# Patient Record
Sex: Female | Born: 1995 | Race: Black or African American | Hispanic: No | Marital: Single | Smoking: Never smoker
Health system: Southern US, Community
[De-identification: ages and names within clinical notes are randomized; demographics above are authoritative.]

---

## 2015-01-03 ENCOUNTER — Encounter (HOSPITAL_COMMUNITY): Payer: Self-pay | Admitting: Emergency Medicine

## 2015-01-03 ENCOUNTER — Emergency Department (INDEPENDENT_AMBULATORY_CARE_PROVIDER_SITE_OTHER)
Admission: EM | Admit: 2015-01-03 | Discharge: 2015-01-03 | Disposition: A | Discharge status: Home / Self Care | Payer: BLUE CROSS/BLUE SHIELD | Source: Home / Self Care | Attending: Family Medicine | Admitting: Family Medicine

## 2015-01-03 DIAGNOSIS — R0981 Nasal congestion: Secondary | ICD-10-CM

## 2015-01-03 MED ORDER — IPRATROPIUM BROMIDE 0.06 % NA SOLN: 2.0000 | Freq: Four times a day (QID) | NASAL | Status: DC

## 2015-01-03 NOTE — ED Provider Notes (Signed)
CSN: [id number]     Arrival date & time 01/03/15  [id number] History   First MD Initiated Contact with Patient 01/03/15 1038     No chief complaint on file.  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 female presenting with URI. The history is provided by the patient.  URI Presenting symptoms: congestion, rhinorrhea and sore throat   Presenting symptoms: no cough, no ear pain, no facial pain, no fatigue and no fever   Presenting symptoms comment:  +scratchy throat.  Severity:  Moderate Onset quality:  Gradual Duration:  2 days Timing:  Constant Progression:  Unchanged Chronicity:  New Associated symptoms: no arthralgias, no headaches, no myalgias and no sneezing     No past medical history on file. No past surgical history on file. No family history on file. History  Substance Use Topics  . Smoking status: Not on file  . Smokeless tobacco: Not on file  . Alcohol Use: Not on file   OB History    No data available     Review of Systems  Constitutional: Negative for fever and fatigue.  HENT: Positive for congestion, rhinorrhea, sinus pressure and sore throat. Negative for ear pain, nosebleeds and sneezing.   Eyes: Negative.   Respiratory: Negative for cough.   Cardiovascular: Negative.   Gastrointestinal: Negative.   Musculoskeletal: Negative for myalgias and arthralgias.  Neurological: Negative for headaches.    Allergies  Review of patient's allergies indicates not on file.  Home Medications   Prior to Admission medications   Medication Sig Start Date End Date Taking? Authorizing Provider  ipratropium (ATROVENT) 0.06 % nasal spray Place 2 sprays into both nostrils 4 (four) times daily. 01/03/15   Jess BartersJennifer Lee H Roshad Hack, PA   BP 130/90 mmHg  Pulse 115  Temp(Src) 98.2 F (36.8 C) (Oral)  Resp 18  SpO2 100% Physical Exam  Constitutional: She is  oriented to person, place, and time. She appears well-developed and well-nourished.  +morbid obesity  HENT:  Head: Normocephalic and atraumatic.  Right Ear: Hearing, tympanic membrane, external ear and ear canal normal.  Left Ear: Hearing, tympanic membrane, external ear and ear canal normal.  Nose: Mucosal edema and rhinorrhea present.  Mouth/Throat: Uvula is midline, oropharynx is clear and moist and mucous membranes are normal.  Eyes: Conjunctivae are normal. No scleral icterus.  Neck: Normal range of motion. Neck supple.  Cardiovascular: Normal rate, regular rhythm and normal heart sounds.   Pulmonary/Chest: Effort normal and breath sounds normal.  Musculoskeletal: Normal range of motion.  Lymphadenopathy:    She has no cervical adenopathy.  Neurological: She is alert and oriented to person, place, and time.  Skin: Skin is warm and dry.  Psychiatric: She has a normal mood and affect. Her behavior is normal.  Nursing note and vitals reviewed.   ED Course  Procedures (including critical care time) Labs Review Labs Reviewed - No data to display  Imaging Review No results found.   MDM   1. Sinus congestion   2. Nasal congestion   Atrovent nasal spray as directed.  Mild common cold. Symptomatic care at home     Ria ClockJennifer Lee H Maryln Eastham, [place] 01/03/15 1121

## 2015-01-03 NOTE — Discharge Instructions (Signed)
Upper Respiratory Infection, Adult An upper respiratory infection (URI) is also sometimes known as the common cold. The upper respiratory tract includes the nose, sinuses, throat, trachea, and bronchi. Bronchi are the airways leading to the lungs. Most people improve within 1 week, but symptoms can last up to 2 weeks. A residual cough may last even longer.  CAUSES Many different viruses can infect the tissues lining the upper respiratory tract. The tissues become irritated and inflamed and often become very moist. Mucus production is also common. A cold is contagious. You can easily spread the virus to others by oral contact. This includes kissing, sharing a glass, coughing, or sneezing. Touching your mouth or nose and then touching a surface, which is then touched by another person, can also spread the virus. SYMPTOMS  Symptoms typically develop 1 to 3 days after you come in contact with a cold virus. Symptoms vary from person to person. They may include:  Runny nose.  Sneezing.  Nasal congestion.  Sinus irritation.  Sore throat.  Loss of voice (laryngitis).  Cough.  Fatigue.  Muscle aches.  Loss of appetite.  Headache.  Low-grade fever. DIAGNOSIS  You might diagnose your own cold based on familiar symptoms, since most people get a cold 2 to 3 times a year. Your caregiver can confirm this based on your exam. Most importantly, your caregiver can check that your symptoms are not due to another disease such as strep throat, sinusitis, pneumonia, asthma, or epiglottitis. Blood tests, throat tests, and X-rays are not necessary to diagnose a common cold, but they may sometimes be helpful in excluding other more serious diseases. Your caregiver will decide if any further tests are required. RISKS AND COMPLICATIONS  You may be at risk for a more severe case of the common cold if you smoke cigarettes, have chronic heart disease (such as heart failure) or lung disease (such as asthma), or if  you have a weakened immune system. The very young and very old are also at risk for more serious infections. Bacterial sinusitis, middle ear infections, and bacterial pneumonia can complicate the common cold. The common cold can worsen asthma and chronic obstructive pulmonary disease (COPD). Sometimes, these complications can require emergency medical care and may be life-threatening. PREVENTION  The best way to protect against getting a cold is to practice good hygiene. Avoid oral or hand contact with people with cold symptoms. Wash your hands often if contact occurs. There is no clear evidence that vitamin C, vitamin E, echinacea, or exercise reduces the chance of developing a cold. However, it is always recommended to get plenty of rest and practice good nutrition. TREATMENT  Treatment is directed at relieving symptoms. There is no cure. Antibiotics are not effective, because the infection is caused by a virus, not by bacteria. Treatment may include:  Increased fluid intake. Sports drinks offer valuable electrolytes, sugars, and fluids.  Breathing heated mist or steam (vaporizer or shower).  Eating chicken soup or other clear broths, and maintaining good nutrition.  Getting plenty of rest.  Using gargles or lozenges for comfort.  Controlling fevers with ibuprofen or acetaminophen as directed by your caregiver.  Increasing usage of your inhaler if you have asthma. Zinc gel and zinc lozenges, taken in the first 24 hours of the common cold, can shorten the duration and lessen the severity of symptoms. Pain medicines may help with fever, muscle aches, and throat pain. A variety of non-prescription medicines are available to treat congestion and runny nose. Your caregiver   can make recommendations and may suggest nasal or lung inhalers for other symptoms.  HOME CARE INSTRUCTIONS   Only take over-the-counter or prescription medicines for pain, discomfort, or fever as directed by your  caregiver.  Use a warm mist humidifier or inhale steam from a shower to increase air moisture. This may keep secretions moist and make it easier to breathe.  Drink enough water and fluids to keep your urine clear or pale yellow.  Rest as needed.  Return to work when your temperature has returned to normal or as your caregiver advises. You may need to stay home longer to avoid infecting others. You can also use a face mask and careful hand washing to prevent spread of the virus. SEEK MEDICAL CARE IF:   After the first few days, you feel you are getting worse rather than better.  You need your caregiver's advice about medicines to control symptoms.  You develop chills, worsening shortness of breath, or brown or red sputum. These may be signs of pneumonia.  You develop yellow or brown nasal discharge or pain in the face, especially when you bend forward. These may be signs of sinusitis.  You develop a fever, swollen neck glands, pain with swallowing, or white areas in the back of your throat. These may be signs of strep throat. SEEK IMMEDIATE MEDICAL CARE IF:   You have a fever.  You develop severe or persistent headache, ear pain, sinus pain, or chest pain.  You develop wheezing, a prolonged cough, cough up blood, or have a change in your usual mucus (if you have chronic lung disease).  You develop sore muscles or a stiff neck. Document Released: 05/14/2001 Document Revised: 02/10/2012 Document Reviewed: 02/23/2014 ExitCare Patient Information 2015 [employer]. This information is not intended to replace advice given to you by your health care provider. Make sure you discuss any questions you have with your health care provider.  

## 2015-01-03 NOTE — ED Notes (Signed)
Sinus pressure for 2 days.

## 2015-10-23 ENCOUNTER — Encounter (HOSPITAL_COMMUNITY): Payer: Self-pay | Admitting: *Deleted

## 2015-10-23 ENCOUNTER — Inpatient Hospital Stay (HOSPITAL_COMMUNITY)
Admission: AD | Admit: 2015-10-23 | Discharge: 2015-10-23 | Disposition: A | Discharge status: Home / Self Care | Payer: BLUE CROSS/BLUE SHIELD | Source: Ambulatory Visit | Attending: Family Medicine | Admitting: Family Medicine

## 2015-10-23 DIAGNOSIS — S3992XA Unspecified injury of lower back, initial encounter: Secondary | ICD-10-CM

## 2015-10-23 DIAGNOSIS — Z041 Encounter for examination and observation following transport accident: Secondary | ICD-10-CM

## 2015-10-23 DIAGNOSIS — M545 Low back pain: Secondary | ICD-10-CM | POA: Diagnosis present

## 2015-10-23 MED ORDER — IBUPROFEN 800 MG PO TABS
800.0000 mg | ORAL_TABLET | Freq: Once | ORAL | Status: AC
  Administered 2015-10-23: 800 mg via ORAL
  Filled 2015-10-23: qty 1
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
## 2015-10-23 NOTE — Discharge Instructions (Signed)
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
## 2015-10-24 ENCOUNTER — Encounter (HOSPITAL_BASED_OUTPATIENT_CLINIC_OR_DEPARTMENT_OTHER): Payer: Self-pay

## 2015-10-24 ENCOUNTER — Emergency Department (HOSPITAL_BASED_OUTPATIENT_CLINIC_OR_DEPARTMENT_OTHER): Payer: No Typology Code available for payment source

## 2015-10-24 ENCOUNTER — Emergency Department (HOSPITAL_BASED_OUTPATIENT_CLINIC_OR_DEPARTMENT_OTHER)
Admission: EM | Admit: 2015-10-24 | Discharge: 2015-10-24 | Disposition: A | Discharge status: Home / Self Care | Payer: No Typology Code available for payment source | Attending: Emergency Medicine | Admitting: Emergency Medicine

## 2015-10-24 DIAGNOSIS — Y998 Other external cause status: Secondary | ICD-10-CM | POA: Diagnosis not present

## 2015-10-24 DIAGNOSIS — Y9389 Activity, other specified: Secondary | ICD-10-CM | POA: Diagnosis not present

## 2015-10-24 DIAGNOSIS — M545 Low back pain, unspecified: Secondary | ICD-10-CM

## 2015-10-24 DIAGNOSIS — S3992XA Unspecified injury of lower back, initial encounter: Secondary | ICD-10-CM | POA: Diagnosis present

## 2015-10-24 DIAGNOSIS — Y92410 Unspecified street and highway as the place of occurrence of the external cause: Secondary | ICD-10-CM | POA: Diagnosis not present

## 2015-10-24 MED ORDER — IBUPROFEN 800 MG PO TABS: 800.0000 mg | ORAL_TABLET | Freq: Three times a day (TID) | ORAL | Status: AC | PRN

## 2015-10-24 NOTE — ED Notes (Signed)
MD at bedside. 
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
## 2015-10-24 NOTE — ED Notes (Signed)
Patient transported to X-ray 
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
